# Patient Record
Sex: Male | Born: 1995 | Hispanic: No | Marital: Single | Smoking: Current every day smoker
Health system: Southern US, Community
[De-identification: ages and names within clinical notes are randomized; demographics above are authoritative.]

---
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
## 2016-11-07 ENCOUNTER — Encounter (HOSPITAL_COMMUNITY): Payer: Self-pay | Admitting: Emergency Medicine

## 2016-11-07 ENCOUNTER — Emergency Department (HOSPITAL_COMMUNITY)
Admission: EM | Admit: 2016-11-07 | Discharge: 2016-11-07 | Disposition: A | Discharge status: Home / Self Care | Payer: Medicaid Other | Attending: Emergency Medicine | Admitting: Emergency Medicine

## 2016-11-07 DIAGNOSIS — H6690 Otitis media, unspecified, unspecified ear: Secondary | ICD-10-CM

## 2016-11-07 DIAGNOSIS — H9203 Otalgia, bilateral: Secondary | ICD-10-CM | POA: Diagnosis present

## 2016-11-07 DIAGNOSIS — H6693 Otitis media, unspecified, bilateral: Secondary | ICD-10-CM | POA: Diagnosis not present

## 2016-11-07 MED ORDER — NEOMYCIN-COLIST-HC-THONZONIUM 3.3-3-10-0.5 MG/ML OT SUSP
3.0000 [drp] | Freq: Once | OTIC | Status: DC
  Filled 2016-11-07: qty 5

## 2016-11-07 MED ORDER — AMOXICILLIN-POT CLAVULANATE 875-125 MG PO TABS
1.0000 | ORAL_TABLET | Freq: Once | ORAL | Status: AC
  Administered 2016-11-07: 1 via ORAL
  Filled 2016-11-07: qty 1

## 2016-11-07 MED ORDER — AMOXICILLIN-POT CLAVULANATE 500-125 MG PO TABS: 1.0000 | ORAL_TABLET | Freq: Three times a day (TID) | ORAL | 0 refills | Status: DC

## 2016-11-07 MED ORDER — NEOMYCIN-POLYMYXIN-HC 1 % OT SOLN
3.0000 [drp] | Freq: Once | OTIC | Status: AC
  Administered 2016-11-07: 3 [drp] via OTIC
  Filled 2016-11-07: qty 10

## 2016-11-07 NOTE — ED Triage Notes (Signed)
Pt states, " I have an ear infection. I had an ear infection 2 years ago and it felt the same." Pt complaining of ear pain and difficulty hearing. Pt denies any drainage from ears.  Pt also complains of cold symptoms x 1 week.

## 2016-11-07 NOTE — ED Provider Notes (Signed)
[facility]-EMERGENCY DEPT Provider Note   CSN: [id number] Arrival date & time: 11/07/16  1614   By signing my name below, I, Freida Busmaniana Omoyeni, attest that this documentation has been prepared under the direction and in the presence of Gerhard Munchobert Britain Saber, MD . Electronically Signed: Freida Busmaniana Omoyeni, Scribe. 11/07/2016. 4:50 PM.  History   Chief Complaint Chief Complaint  Patient presents with  . Otalgia    The history is provided by the patient. No language interpreter was used.     HPI Comments:  Joseph Bartlett is a 20 y.o. male who presents to the Emergency Department complaining of constant, moderate, bilateral ear pain since this AM. He reports associated congestion and decreased hearing.  Pt states he felt fine when he went to bed last night. No alleviating factors noted. He denies fever, nausea, CP, SOB, confusion, and speech changes.     History reviewed. No pertinent past medical history.  There are no active problems to display for this patient.   History reviewed. No pertinent surgical history.     Home Medications    Prior to Admission medications   Medication Sig Start Date End Date Taking? Authorizing Provider  cephALEXin (KEFLEX) 500 MG capsule Take 1 capsule (500 mg total) by mouth 4 (four) times daily. 05/20/15   Eyvonne MechanicJeffrey Hedges, PA-C  ibuprofen (ADVIL,MOTRIN) 800 MG tablet Take 1 tablet (800 mg total) by mouth 3 (three) times daily. 11/13/15   Cheri FowlerKayla Rose, PA-C    Family History No family history on file.  Social History Social History  Substance Use Topics  . Smoking status: Never Smoker  . Smokeless tobacco: Never Used  . Alcohol use No     Allergies   Patient has no known allergies.   Review of Systems Review of Systems  Constitutional:       Per HPI, otherwise negative  HENT:       Per HPI, otherwise negative  Respiratory:       Per HPI, otherwise negative  Cardiovascular:       Per HPI, otherwise negative  Gastrointestinal: Negative for vomiting.    Endocrine:       Negative aside from HPI  Genitourinary:       Neg aside from HPI   Musculoskeletal:       Per HPI, otherwise negative  Skin: Negative.   Neurological: Negative for syncope.     Physical Exam Updated Vital Signs BP 148/66 (BP Location: Left Arm)   Pulse 68   Temp 98.2 F (36.8 C) (Oral)   Resp 15   SpO2 96%   Physical Exam  Constitutional: He is oriented to person, place, and time. He appears well-developed. No distress.  HENT:  Head: Normocephalic and atraumatic.  Right Ear: Tympanic membrane is injected. A middle ear effusion is present.  Left Ear: Tympanic membrane is injected. A middle ear effusion is present.  Mouth/Throat: Oropharynx is clear and moist.  Eyes: Conjunctivae and EOM are normal.  Cardiovascular: Normal rate and regular rhythm.   Pulmonary/Chest: Effort normal. No stridor. No respiratory distress.  Abdominal: He exhibits no distension.  Musculoskeletal: He exhibits no edema.  Neurological: He is alert and oriented to person, place, and time.  Skin: Skin is warm and dry.  Psychiatric: He has a normal mood and affect.  Nursing note and vitals reviewed.    ED Treatments / Results  DIAGNOSTIC STUDIES:  Oxygen Saturation is 96% on RA, normal by my interpretation.    COORDINATION OF CARE:  4:40 PM Discussed  treatment plan with pt at bedside and pt agreed to plan.    Procedures Procedures (including critical care time)  Medications Ordered in ED Medications  NEOMYCIN-POLYMYXIN-HYDROCORTISONE (CORTISPORIN) otic solution 3 drop (not administered)  amoxicillin-clavulanate (AUGMENTIN) 875-125 MG per tablet 1 tablet (1 tablet Oral Given 11/07/16 1703)     Initial Impression / Assessment and Plan / ED Course  I have reviewed the triage vital signs and the nursing notes.  Pertinent labs & imaging results that were available during my care of the patient were reviewed by me and considered in my medical decision making (see chart for  details).  Clinical Course     Generally well-appearing male presents with new right greater than left ear pain over the past day. On exam he is awake and alert, with no evidence for bacteremia or sepsis. There are some evidence for bilateral ear infection, patient started on oral and topical antibiotics with analgesia.   Final Clinical Impressions(s) / ED Diagnoses   Final diagnoses:  Acute otitis media, unspecified otitis media type    New Prescriptions New Prescriptions   AMOXICILLIN-CLAVULANATE (AUGMENTIN) 500-125 MG TABLET    Take 1 tablet (500 mg total) by mouth every 8 (eight) hours.    I personally performed the services described in this documentation, which was scribed in my presence. The recorded information has been reviewed and is accurate.        Gerhard Munchobert Starlene Consuegra, MD 11/07/16 [phone]

## 2016-11-07 NOTE — Discharge Instructions (Signed)
As discussed, it is important use all medication as directed, including prescribed antibiotics, and the provided antibiotic drops.  Please use the antibiotic drops 3 drops 3 times daily in each year for the next 5 days. Be sure to follow-up with your primary care physician.

## 2018-11-19 ENCOUNTER — Other Ambulatory Visit: Payer: Self-pay

## 2018-11-19 ENCOUNTER — Encounter (HOSPITAL_COMMUNITY): Payer: Self-pay

## 2018-11-19 ENCOUNTER — Emergency Department (HOSPITAL_COMMUNITY)
Admission: EM | Admit: 2018-11-19 | Discharge: 2018-11-19 | Disposition: A | Discharge status: Home / Self Care | Payer: Self-pay | Attending: Emergency Medicine | Admitting: Emergency Medicine

## 2018-11-19 DIAGNOSIS — J029 Acute pharyngitis, unspecified: Secondary | ICD-10-CM | POA: Insufficient documentation

## 2018-11-19 LAB — GROUP A STREP BY PCR: Group A Strep by PCR: NOT DETECTED

## 2018-11-19 MED ORDER — NAPROXEN 500 MG PO TABS: 500.0000 mg | ORAL_TABLET | Freq: Two times a day (BID) | ORAL | 0 refills | Status: DC

## 2018-11-19 NOTE — Discharge Instructions (Addendum)
Drink plenty of fluids, you can also try taking over-the-counter Cepacol, we will call you if the strep culture comes back positive.

## 2018-11-19 NOTE — ED Provider Notes (Signed)
[facility]   CSN: [id number] Arrival date & time: 11/19/18  1316    History   Chief Complaint Chief Complaint  Patient presents with  . Sore Throat    HPI Joseph Bartlett is a 22 y.o. male.  HPI Patient presented to the emergency room for evaluation of her throat.  Patient states his symptoms started the last couple of days.  It hurts and burns when he tries to swallow.  He is noticed some swollen glands in his neck.  They are tender to the touch.  Patient has had subjective fevers but has not measured a temperature.  He denies any difficulty speaking.  No cough.  No nasal congestion. History reviewed. No pertinent past medical history.  There are no active problems to display for this patient.   History reviewed. No pertinent surgical history.      Home Medications    Prior to Admission medications   Medication Sig Start Date End Date Taking? Authorizing Provider  amoxicillin-clavulanate (AUGMENTIN) 500-125 MG tablet Take 1 tablet (500 mg total) by mouth every 8 (eight) hours. 11/07/16   Gerhard MunchLockwood, Robert, MD  ibuprofen (ADVIL,MOTRIN) 800 MG tablet Take 1 tablet (800 mg total) by mouth 3 (three) times daily. 11/13/15   Cheri Fowlerose, Kayla, PA-C  naproxen (NAPROSYN) 500 MG tablet Take 1 tablet (500 mg total) by mouth 2 (two) times daily. 11/19/18   Linwood DibblesKnapp, Angelmarie Ponzo, MD    Family History No family history on file.  Social History Social History   Tobacco Use  . Smoking status: Never Smoker  . Smokeless tobacco: Never Used  Substance Use Topics  . Alcohol use: No  . Drug use: No     Allergies   Patient has no known allergies.   Review of Systems Review of Systems  All other systems reviewed and are negative.    Physical Exam Updated Vital Signs BP 134/70 (BP Location: Right Arm)   Pulse 95   Temp 99.4 F (37.4 C) (Oral)   Resp 18   Ht 1.651 m (5\' 5" )   Wt 90.7 kg   SpO2 98%   BMI 33.28 kg/m   Physical  Exam Vitals signs and nursing note reviewed.  Constitutional:      General: He is not in acute distress.    Appearance: He is well-developed.  HENT:     Head: Normocephalic and atraumatic.     Right Ear: External ear normal.     Left Ear: External ear normal.     Mouth/Throat:     Mouth: Mucous membranes are moist.     Pharynx: Pharyngeal swelling, oropharyngeal exudate and posterior oropharyngeal erythema present.     Tonsils: Tonsillar exudate present. No tonsillar abscesses. Swelling: 3+ on the right. 3+ on the left.  Eyes:     General: No scleral icterus.       Right eye: No discharge.        Left eye: No discharge.     Conjunctiva/sclera: Conjunctivae normal.  Neck:     Musculoskeletal: Neck supple.     Trachea: No tracheal deviation.  Cardiovascular:     Rate and Rhythm: Normal rate.  Pulmonary:     Effort: Pulmonary effort is normal. No respiratory distress.     Breath sounds: No stridor.  Abdominal:     General: There is no distension.  Lymphadenopathy:     Cervical: Cervical adenopathy present.  Skin:    General: Skin is warm and dry.  Findings: No rash.  Neurological:     Mental Status: He is alert.     Cranial Nerves: Cranial nerve deficit: no gross deficits.      ED Treatments / Results  Labs (all labs ordered are listed, but only abnormal results are displayed) Labs Reviewed  GROUP A STREP BY PCR  CULTURE, GROUP A STREP [facility]     Procedures Procedures (including critical care time)  Medications Ordered in ED Medications - No data to display   Initial Impression / Assessment and Plan / ED Course  I have reviewed the triage vital signs and the nursing notes.  Pertinent labs & imaging results that were available during my care of the patient were reviewed by me and considered in my medical decision making (see chart for details).   Patient strep screen is negative.  This is most likely a viral pharyngitis.  No findings to suggest an abscess.   Plan on discharge home with Naprosyn and recommend taking Cepacol.  Throat culture was sent off for additional analysis  Final Clinical Impressions(s) / ED Diagnoses   Final diagnoses:  Viral pharyngitis    ED Discharge Orders         Ordered    naproxen (NAPROSYN) 500 MG tablet  2 times daily     11/19/18 1455           Linwood DibblesKnapp, Shaquon Gropp, MD 11/19/18 1457

## 2018-11-19 NOTE — ED Triage Notes (Signed)
Pt c/o sore throat X 3 days reports last taking ibuprofen around 7 am today. Pt also c/o swollen and tender neck.

## 2018-11-19 NOTE — ED Notes (Signed)
Pt verbalized understanding of discharge instructions and denies any further questions at this time.   

## 2018-11-20 ENCOUNTER — Encounter (HOSPITAL_COMMUNITY): Payer: Self-pay | Admitting: Emergency Medicine

## 2020-09-11 ENCOUNTER — Encounter (HOSPITAL_COMMUNITY): Payer: Self-pay | Admitting: *Deleted

## 2020-09-11 ENCOUNTER — Other Ambulatory Visit: Payer: Self-pay

## 2020-09-11 ENCOUNTER — Emergency Department (HOSPITAL_COMMUNITY)
Admission: EM | Admit: 2020-09-11 | Discharge: 2020-09-11 | Disposition: A | Discharge status: Home / Self Care | Payer: Medicaid Other | Attending: Emergency Medicine | Admitting: Emergency Medicine

## 2020-09-11 ENCOUNTER — Emergency Department (HOSPITAL_COMMUNITY): Payer: Medicaid Other

## 2020-09-11 ENCOUNTER — Other Ambulatory Visit: Payer: Self-pay | Admitting: Orthopedic Surgery

## 2020-09-11 DIAGNOSIS — S62316A Displaced fracture of base of fifth metacarpal bone, right hand, initial encounter for closed fracture: Secondary | ICD-10-CM | POA: Insufficient documentation

## 2020-09-11 DIAGNOSIS — S62339A Displaced fracture of neck of unspecified metacarpal bone, initial encounter for closed fracture: Secondary | ICD-10-CM

## 2020-09-11 DIAGNOSIS — W228XXA Striking against or struck by other objects, initial encounter: Secondary | ICD-10-CM | POA: Insufficient documentation

## 2020-09-11 NOTE — ED Triage Notes (Signed)
The pt has had some rt hand pain 2 days ago at work when his hand went through the sheet rock  Pain over his 5thmetacarpal

## 2020-09-11 NOTE — Consult Note (Addendum)
Reason for Consult:Right 5th [facility] fx Referring Physician: R Little  Joseph Bartlett is an 24 y.o. male.  HPI: [name] hit a 2x4 with his right hand ~10d ago. He's been having pain ever since. He went and saw someone who works with hands (not a provider) who first told him it was ok, just dislocated. Then he went back and he told him it was broken. He is RHD.  History reviewed. No pertinent past medical history.  History reviewed. No pertinent surgical history.  No family history on file.  Social History:  reports that he has never smoked. He has never used smokeless tobacco. He reports that he does not drink alcohol and does not use drugs.  Allergies: No Known Allergies  Medications: I have reviewed the patient's current medications.  No results found for this or any previous visit (from the past 48 hour(s)).  DG Hand Complete Right  Result Date: 09/11/2020 CLINICAL DATA:  Right hand pain, right hand injury EXAM: RIGHT HAND - COMPLETE 3+ VIEW COMPARISON:  11/13/2015 FINDINGS: None three view radiograph right hand demonstrates a subacute fracture of the mid-diaphysis of the right fifth metacarpal demonstrating moderate volar angulation and incomplete bridging callus in keeping with incomplete interval healing. Extensive surrounding soft tissue swelling is present. No other fracture or dislocation. Joint spaces are preserved. IMPRESSION: Subacute, angulated mid diaphyseal fracture of the right fifth metacarpal demonstrating incomplete healing. Electronically Signed   By: Helyn Numbers MD   On: 09/11/2020 00:38    Review of Systems  HENT: Negative for ear discharge, ear pain, hearing loss and tinnitus.   Eyes: Negative for photophobia and pain.  Respiratory: Negative for cough and shortness of breath.   Cardiovascular: Negative for chest pain.  Gastrointestinal: Negative for abdominal pain, nausea and vomiting.  Genitourinary: Negative for dysuria, flank pain, frequency and urgency.   Musculoskeletal: Positive for arthralgias (Right hand). Negative for back pain, myalgias and neck pain.  Neurological: Negative for dizziness and headaches.  Hematological: Does not bruise/bleed easily.  Psychiatric/Behavioral: The patient is not nervous/anxious.    Blood pressure (!) 127/57, pulse (!) 57, temperature 98 F (36.7 C), temperature source Oral, resp. rate 16, height 5\' 5"  (1.651 m), weight 90.7 kg, SpO2 99 %. Physical Exam Constitutional:      General: He is not in acute distress.    Appearance: He is well-developed. He is not diaphoretic.  HENT:     Head: Normocephalic and atraumatic.  Eyes:     General: No scleral icterus.       Right eye: No discharge.        Left eye: No discharge.     Conjunctiva/sclera: Conjunctivae normal.  Cardiovascular:     Rate and Rhythm: Normal rate and regular rhythm.  Pulmonary:     Effort: Pulmonary effort is normal. No respiratory distress.  Musculoskeletal:     Cervical back: Normal range of motion.     Comments: Right shoulder, elbow, wrist, digits- no skin wounds, mod TTP 5th MC w/deformity, no instability, no blocks to motion  Sens  Ax/R/M/U intact  Mot   Ax/ R/ PIN/ M/ AIN/ U intact  Rad 2+  Skin:    General: Skin is warm and dry.  Neurological:     Mental Status: He is alert.  Psychiatric:        Behavior: Behavior normal.     Assessment/Plan: Right 5th [facility] fx -- Dr. to evaluate and decide on plan.    Janee Morn, PA-C  Orthopedic Surgery [phone] 09/11/2020, 9:04 AM   Patient evaluated and examined by me, and I agree with above.  History provided to me indicates that the fracture was perhaps longer than a week ago, perhaps even a month ago.  It occurred when his hand struck a 2 x 4 at work by his report.  Right fifth metacarpal obvious apex dorsal angulation fracture, with nearly full flexion, but 30 degree extensor lag at the MP joint.  No significant malrotation, no longer swollen, fracture  not mobile to manipulation, MVI  Displaced angulated right fifth metacarpal diaphyseal fracture, now perhaps a month old with visible callus on x-ray  I discussed these findings with him and reviewed what he could expect with continued nonoperative treatment, with the depressed posture of the "knuckle", some prominence of the metacarpal head on the palmar side, and more importantly the continued extensor lag.  I reviewed with him what operative reconstruction would entail and the goals, risk, and options.  He indicated he would like to proceed operatively.  We will work to schedule this as an outpatient.  Given his state today, he may remain unsplinted, etc.  Mack Hook, MD Hand Surgery

## 2020-09-11 NOTE — ED Provider Notes (Signed)
[facility] EMERGENCY DEPARTMENT Provider Note   CSN: [id number] Arrival date & time: 09/11/20  0006     History Chief Complaint  Patient presents with  . Hand Pain    Joseph Bartlett is a 24 y.o. male.  24 year old male who presents with right hand injury. 10 days ago, patient was at work [employer] when his hand slipped and he accidentally punched the wall in front of him. He initially had pain over his fifth metacarpal and swelling which he thought would improve but he continues to have mild pain and the swelling has not gone down. He reports normal sensation in his fingers and full range of motion of his fingers. He did not sustain any lacerations and denies any other areas of pain. He is right-handed.  The history is provided by the patient.  Hand Pain       History reviewed. No pertinent past medical history.  There are no problems to display for this patient.   History reviewed. No pertinent surgical history.     No family history on file.  Social History   Tobacco Use  . Smoking status: Never Smoker  . Smokeless tobacco: Never Used  Substance Use Topics  . Alcohol use: No  . Drug use: No    Types: Marijuana    Home Medications Prior to Admission medications   Medication Sig Start Date End Date Taking? Authorizing Provider  amoxicillin-clavulanate (AUGMENTIN) 500-125 MG tablet Take 1 tablet (500 mg total) by mouth every 8 (eight) hours. 11/07/16   Gerhard Munch, MD  ibuprofen (ADVIL,MOTRIN) 200 MG tablet Take 400 mg by mouth every 6 (six) hours as needed. For pain    [provider]  ibuprofen (ADVIL,MOTRIN) 800 MG tablet Take 1 tablet (800 mg total) by mouth 3 (three) times daily. 11/13/15   Cheri Fowler, PA-C  naproxen (NAPROSYN) 375 MG tablet Take 1 tablet (375 mg total) by mouth 2 (two) times daily as needed (pain). Patient not taking: Reported on 11/10/2014 09/06/12   Marcellina Millin, MD  naproxen (NAPROSYN) 500 MG  tablet Take 1 tablet (500 mg total) by mouth 2 (two) times daily. 11/19/18   Linwood Dibbles, MD  oxyCODONE-acetaminophen (PERCOCET) 5-325 MG per tablet Take 1 tablet by mouth every 6 (six) hours as needed for severe pain. 11/10/14   Pricilla Loveless, MD    Allergies    Patient has no known allergies.  Review of Systems   Review of Systems  Musculoskeletal: Positive for joint swelling.  Skin: Negative for wound.  Neurological: Negative for numbness.    Physical Exam Updated Vital Signs BP (!) 127/57 (BP Location: Left Arm)   Pulse (!) 57   Temp 98 F (36.7 C) (Oral)   Resp 16   Ht 5\' 5"  (1.651 m)   Wt 90.7 kg   SpO2 99%   BMI 33.27 kg/m   Physical Exam Vitals and nursing note reviewed.  Constitutional:      General: He is not in acute distress.    Appearance: He is well-developed.  HENT:     Head: Normocephalic and atraumatic.  Eyes:     Conjunctiva/sclera: Conjunctivae normal.  Cardiovascular:     Pulses: Normal pulses.  Musculoskeletal:        General: Swelling and signs of injury present.     Cervical back: Neck supple.     Comments: R hand: edema and tenderness over distal dorsal 5th metacarpal; 5th finger rotates radially when making a fist although  not complete overlapping with 4th finger; full ROM fingers with normal sensation and strength; normal ROM wrist  Skin:    General: Skin is warm and dry.  Neurological:     Mental Status: He is alert and oriented to person, place, and time.     Sensory: No sensory deficit.  Psychiatric:        Judgment: Judgment normal.     ED Results / Procedures / Treatments   Labs (all labs ordered are listed, but only abnormal results are displayed) Labs Reviewed - No data to display  EKG None  Radiology DG Hand Complete Right  Result Date: 09/11/2020 CLINICAL DATA:  Right hand pain, right hand injury EXAM: RIGHT HAND - COMPLETE 3+ VIEW COMPARISON:  11/13/2015 FINDINGS: None three view radiograph right hand demonstrates a  subacute fracture of the mid-diaphysis of the right fifth metacarpal demonstrating moderate volar angulation and incomplete bridging callus in keeping with incomplete interval healing. Extensive surrounding soft tissue swelling is present. No other fracture or dislocation. Joint spaces are preserved. IMPRESSION: Subacute, angulated mid diaphyseal fracture of the right fifth metacarpal demonstrating incomplete healing. Electronically Signed   By: Helyn Numbers MD   On: 09/11/2020 00:38    Procedures Procedures (including critical care time)  Medications Ordered in ED Medications - No data to display  ED Course  I have reviewed the triage vital signs and the nursing notes.  Pertinent imaging results that were available during my care of the patient were reviewed by me and considered in my medical decision making (see chart for details).    MDM Rules/Calculators/A&P                          Neurovascularly intact. XR shows subacute angulated fx R 5th metacarpal. Discussed w/ ortho hand, Charma Igo and Dr. Janee Morn. Given pt is already healing, suspect injury was greater than 10d ago and pt doesn't require splinting today. They will contact for outpatient surgical repair. Pt voiced understanding of plan. Final Clinical Impression(s) / ED Diagnoses Final diagnoses:  Closed boxer's fracture, initial encounter    Rx / DC Orders ED Discharge Orders    None       Charnita Trudel, Ambrose Finland, MD 09/11/20 [phone]

## 2020-09-11 NOTE — H&P (View-Only) (Signed)
Reason for Consult:Right 5th [facility] fx Referring Physician: R Little  Joseph Bartlett is an 24 y.o. male.  HPI: Joseph Bartlett hit a 2x4 with his right hand ~10d ago. He's been having pain ever since. He went and saw someone who works with hands (not a provider) who first told him it was ok, just dislocated. Then he went back and he told him it was broken. He is RHD.  History reviewed. No pertinent past medical history.  History reviewed. No pertinent surgical history.  No family history on file.  Social History:  reports that he has never smoked. He has never used smokeless tobacco. He reports that he does not drink alcohol and does not use drugs.  Allergies: No Known Allergies  Medications: I have reviewed the patient's current medications.  No results found for this or any previous visit (from the past 48 hour(s)).  DG Hand Complete Right  Result Date: 09/11/2020 CLINICAL DATA:  Right hand pain, right hand injury EXAM: RIGHT HAND - COMPLETE 3+ VIEW COMPARISON:  11/13/2015 FINDINGS: None three view radiograph right hand demonstrates a subacute fracture of the mid-diaphysis of the right fifth metacarpal demonstrating moderate volar angulation and incomplete bridging callus in keeping with incomplete interval healing. Extensive surrounding soft tissue swelling is present. No other fracture or dislocation. Joint spaces are preserved. IMPRESSION: Subacute, angulated mid diaphyseal fracture of the right fifth metacarpal demonstrating incomplete healing. Electronically Signed   By: Ashesh  Parikh MD   On: 09/11/2020 00:38    Review of Systems  HENT: Negative for ear discharge, ear pain, hearing loss and tinnitus.   Eyes: Negative for photophobia and pain.  Respiratory: Negative for cough and shortness of breath.   Cardiovascular: Negative for chest pain.  Gastrointestinal: Negative for abdominal pain, nausea and vomiting.  Genitourinary: Negative for dysuria, flank pain, frequency and urgency.   Musculoskeletal: Positive for arthralgias (Right hand). Negative for back pain, myalgias and neck pain.  Neurological: Negative for dizziness and headaches.  Hematological: Does not bruise/bleed easily.  Psychiatric/Behavioral: The patient is not nervous/anxious.    Blood pressure (!) 127/57, pulse (!) 57, temperature 98 F (36.7 C), temperature source Oral, resp. rate 16, height 5' 5" (1.651 m), weight 90.7 kg, SpO2 99 %. Physical Exam Constitutional:      General: He is not in acute distress.    Appearance: He is well-developed. He is not diaphoretic.  HENT:     Head: Normocephalic and atraumatic.  Eyes:     General: No scleral icterus.       Right eye: No discharge.        Left eye: No discharge.     Conjunctiva/sclera: Conjunctivae normal.  Cardiovascular:     Rate and Rhythm: Normal rate and regular rhythm.  Pulmonary:     Effort: Pulmonary effort is normal. No respiratory distress.  Musculoskeletal:     Cervical back: Normal range of motion.     Comments: Right shoulder, elbow, wrist, digits- no skin wounds, mod TTP 5th MC w/deformity, no instability, no blocks to motion  Sens  Ax/R/M/U intact  Mot   Ax/ R/ PIN/ M/ AIN/ U intact  Rad 2+  Skin:    General: Skin is warm and dry.  Neurological:     Mental Status: He is alert.  Psychiatric:        Behavior: Behavior normal.     Assessment/Plan: Right 5th MC fx -- Dr. Suzy Kugel to evaluate and decide on plan.    Joseph J. Jeffery, PA-C   Orthopedic Surgery [phone] 09/11/2020, 9:04 AM   Patient evaluated and examined by me, and I agree with above.  History provided to me indicates that the fracture was perhaps longer than a week ago, perhaps even a month ago.  It occurred when his hand struck a 2 x 4 at work by his report.  Right fifth metacarpal obvious apex dorsal angulation fracture, with nearly full flexion, but 30 degree extensor lag at the MP joint.  No significant malrotation, no longer swollen, fracture  not mobile to manipulation, MVI  Displaced angulated right fifth metacarpal diaphyseal fracture, now perhaps a month old with visible callus on x-ray  I discussed these findings with him and reviewed what he could expect with continued nonoperative treatment, with the depressed posture of the "knuckle", some prominence of the metacarpal head on the palmar side, and more importantly the continued extensor lag.  I reviewed with him what operative reconstruction would entail and the goals, risk, and options.  He indicated he would like to proceed operatively.  We will work to schedule this as an outpatient.  Given his state today, he may remain unsplinted, etc.  Mack Hook, MD Hand Surgery

## 2020-09-12 ENCOUNTER — Encounter (HOSPITAL_BASED_OUTPATIENT_CLINIC_OR_DEPARTMENT_OTHER): Payer: Self-pay | Admitting: Orthopedic Surgery

## 2020-09-12 ENCOUNTER — Other Ambulatory Visit: Payer: Self-pay

## 2020-09-12 DIAGNOSIS — S62339A Displaced fracture of neck of unspecified metacarpal bone, initial encounter for closed fracture: Secondary | ICD-10-CM

## 2020-09-12 HISTORY — DX: Displaced fracture of neck of unspecified metacarpal bone, initial encounter for closed fracture: S62.339A

## 2020-09-14 ENCOUNTER — Other Ambulatory Visit (HOSPITAL_COMMUNITY)
Admission: RE | Admit: 2020-09-14 | Discharge: 2020-09-14 | Disposition: A | Discharge status: Home / Self Care | Payer: Medicaid Other | Source: Ambulatory Visit | Attending: Orthopedic Surgery | Admitting: Orthopedic Surgery

## 2020-09-14 DIAGNOSIS — Z01812 Encounter for preprocedural laboratory examination: Secondary | ICD-10-CM | POA: Diagnosis not present

## 2020-09-14 DIAGNOSIS — Z20822 Contact with and (suspected) exposure to covid-19: Secondary | ICD-10-CM | POA: Diagnosis not present

## 2020-09-14 LAB — SARS CORONAVIRUS 2 (TAT 6-24 HRS): SARS Coronavirus 2: NEGATIVE

## 2020-09-19 ENCOUNTER — Encounter (HOSPITAL_BASED_OUTPATIENT_CLINIC_OR_DEPARTMENT_OTHER): Payer: Self-pay | Admitting: Orthopedic Surgery

## 2020-09-21 ENCOUNTER — Other Ambulatory Visit (HOSPITAL_COMMUNITY): Payer: Medicaid Other

## 2020-09-22 ENCOUNTER — Other Ambulatory Visit (HOSPITAL_COMMUNITY)
Admission: RE | Admit: 2020-09-22 | Discharge: 2020-09-22 | Disposition: A | Discharge status: Home / Self Care | Payer: Medicaid Other | Source: Ambulatory Visit | Attending: Orthopedic Surgery | Admitting: Orthopedic Surgery

## 2020-09-22 DIAGNOSIS — Z01812 Encounter for preprocedural laboratory examination: Secondary | ICD-10-CM | POA: Diagnosis not present

## 2020-09-22 DIAGNOSIS — Z20822 Contact with and (suspected) exposure to covid-19: Secondary | ICD-10-CM | POA: Insufficient documentation

## 2020-09-22 LAB — SARS CORONAVIRUS 2 (TAT 6-24 HRS): SARS Coronavirus 2: NEGATIVE

## 2020-09-25 ENCOUNTER — Ambulatory Visit (HOSPITAL_COMMUNITY): Payer: Self-pay

## 2020-09-25 ENCOUNTER — Other Ambulatory Visit: Payer: Self-pay

## 2020-09-25 ENCOUNTER — Ambulatory Visit (HOSPITAL_BASED_OUTPATIENT_CLINIC_OR_DEPARTMENT_OTHER): Payer: Self-pay | Admitting: Certified Registered"

## 2020-09-25 ENCOUNTER — Ambulatory Visit (HOSPITAL_BASED_OUTPATIENT_CLINIC_OR_DEPARTMENT_OTHER)
Admission: RE | Admit: 2020-09-25 | Discharge: 2020-09-25 | Disposition: A | Discharge status: Home / Self Care | Payer: Self-pay | Attending: Orthopedic Surgery | Admitting: Orthopedic Surgery

## 2020-09-25 ENCOUNTER — Encounter (HOSPITAL_BASED_OUTPATIENT_CLINIC_OR_DEPARTMENT_OTHER)
Admission: RE | Disposition: A | Discharge status: Home / Self Care | Payer: Self-pay | Source: Home / Self Care | Attending: Orthopedic Surgery

## 2020-09-25 ENCOUNTER — Encounter (HOSPITAL_BASED_OUTPATIENT_CLINIC_OR_DEPARTMENT_OTHER): Payer: Self-pay | Admitting: Orthopedic Surgery

## 2020-09-25 DIAGNOSIS — S62309A Unspecified fracture of unspecified metacarpal bone, initial encounter for closed fracture: Secondary | ICD-10-CM

## 2020-09-25 DIAGNOSIS — Y939 Activity, unspecified: Secondary | ICD-10-CM | POA: Insufficient documentation

## 2020-09-25 DIAGNOSIS — W2209XA Striking against other stationary object, initial encounter: Secondary | ICD-10-CM | POA: Insufficient documentation

## 2020-09-25 DIAGNOSIS — S62396A Other fracture of fifth metacarpal bone, right hand, initial encounter for closed fracture: Secondary | ICD-10-CM | POA: Insufficient documentation

## 2020-09-25 HISTORY — PX: OPEN REDUCTION INTERNAL FIXATION (ORIF) METACARPAL: SHX6234

## 2020-09-25 SURGERY — OPEN REDUCTION INTERNAL FIXATION (ORIF) METACARPAL
Anesthesia: Regional | Site: Finger | Laterality: Right

## 2020-09-25 MED ORDER — LACTATED RINGERS IV SOLN: INTRAVENOUS | Status: DC

## 2020-09-25 MED ORDER — OXYCODONE HCL 5 MG/5ML PO SOLN: 5.0000 mg | Freq: Once | ORAL | Status: DC | PRN

## 2020-09-25 MED ORDER — CEFAZOLIN SODIUM-DEXTROSE 2-4 GM/100ML-% IV SOLN
INTRAVENOUS | Status: AC
  Filled 2020-09-25: qty 100

## 2020-09-25 MED ORDER — AMISULPRIDE (ANTIEMETIC) 5 MG/2ML IV SOLN: 10.0000 mg | Freq: Once | INTRAVENOUS | Status: DC | PRN

## 2020-09-25 MED ORDER — ACETAMINOPHEN 160 MG/5ML PO SOLN: 325.0000 mg | Freq: Once | ORAL | Status: DC | PRN

## 2020-09-25 MED ORDER — BUPIVACAINE-EPINEPHRINE (PF) 0.5% -1:200000 IJ SOLN
INTRAMUSCULAR | Status: AC
  Filled 2020-09-25: qty 30

## 2020-09-25 MED ORDER — FENTANYL CITRATE (PF) 100 MCG/2ML IJ SOLN: 25.0000 ug | INTRAMUSCULAR | Status: DC | PRN

## 2020-09-25 MED ORDER — ACETAMINOPHEN 325 MG PO TABS: 650.0000 mg | ORAL_TABLET | Freq: Four times a day (QID) | ORAL | 0 refills | Status: AC

## 2020-09-25 MED ORDER — DEXMEDETOMIDINE (PRECEDEX) IN NS 20 MCG/5ML (4 MCG/ML) IV SYRINGE
PREFILLED_SYRINGE | INTRAVENOUS | Status: DC | PRN
  Administered 2020-09-25: 4 ug via INTRAVENOUS
  Administered 2020-09-25: 8 ug via INTRAVENOUS

## 2020-09-25 MED ORDER — CEFAZOLIN SODIUM-DEXTROSE 2-4 GM/100ML-% IV SOLN
2.0000 g | INTRAVENOUS | Status: AC
  Administered 2020-09-25: 2 g via INTRAVENOUS

## 2020-09-25 MED ORDER — MEPERIDINE HCL 25 MG/ML IJ SOLN: 6.2500 mg | INTRAMUSCULAR | Status: DC | PRN

## 2020-09-25 MED ORDER — PROPOFOL 10 MG/ML IV BOLUS
INTRAVENOUS | Status: DC | PRN
  Administered 2020-09-25: 200 mg via INTRAVENOUS

## 2020-09-25 MED ORDER — BUPIVACAINE HCL (PF) 0.5 % IJ SOLN
INTRAMUSCULAR | Status: AC
  Filled 2020-09-25: qty 30

## 2020-09-25 MED ORDER — ACETAMINOPHEN 325 MG PO TABS: 325.0000 mg | ORAL_TABLET | Freq: Once | ORAL | Status: DC | PRN

## 2020-09-25 MED ORDER — BUPIVACAINE-EPINEPHRINE (PF) 0.5% -1:200000 IJ SOLN
INTRAMUSCULAR | Status: DC | PRN
  Administered 2020-09-25: 30 mL via PERINEURAL

## 2020-09-25 MED ORDER — FENTANYL CITRATE (PF) 100 MCG/2ML IJ SOLN
100.0000 ug | Freq: Once | INTRAMUSCULAR | Status: AC
  Administered 2020-09-25: 100 ug via INTRAVENOUS

## 2020-09-25 MED ORDER — PROPOFOL 500 MG/50ML IV EMUL
INTRAVENOUS | Status: DC | PRN
  Administered 2020-09-25: 125 ug/kg/min via INTRAVENOUS

## 2020-09-25 MED ORDER — ACETAMINOPHEN 10 MG/ML IV SOLN: 1000.0000 mg | Freq: Once | INTRAVENOUS | Status: DC | PRN

## 2020-09-25 MED ORDER — ONDANSETRON HCL 4 MG/2ML IJ SOLN
INTRAMUSCULAR | Status: DC | PRN
  Administered 2020-09-25: 4 mg via INTRAVENOUS

## 2020-09-25 MED ORDER — OXYCODONE HCL 5 MG PO TABS
5.0000 mg | ORAL_TABLET | Freq: Four times a day (QID) | ORAL | 0 refills | Status: AC | PRN
Start: 2020-09-25 — End: ?

## 2020-09-25 MED ORDER — EPINEPHRINE PF 1 MG/ML IJ SOLN
INTRAMUSCULAR | Status: AC
  Filled 2020-09-25: qty 2

## 2020-09-25 MED ORDER — MIDAZOLAM HCL 2 MG/2ML IJ SOLN
INTRAMUSCULAR | Status: AC
  Filled 2020-09-25: qty 2

## 2020-09-25 MED ORDER — LIDOCAINE HCL (PF) 1 % IJ SOLN
INTRAMUSCULAR | Status: AC
  Filled 2020-09-25: qty 30

## 2020-09-25 MED ORDER — OXYCODONE HCL 5 MG PO TABS: 5.0000 mg | ORAL_TABLET | Freq: Once | ORAL | Status: DC | PRN

## 2020-09-25 MED ORDER — LIDOCAINE 2% (20 MG/ML) 5 ML SYRINGE
INTRAMUSCULAR | Status: DC | PRN
  Administered 2020-09-25: 30 mg via INTRAVENOUS

## 2020-09-25 MED ORDER — IBUPROFEN 200 MG PO TABS: 600.0000 mg | ORAL_TABLET | Freq: Four times a day (QID) | ORAL | 0 refills | Status: AC

## 2020-09-25 MED ORDER — MIDAZOLAM HCL 2 MG/2ML IJ SOLN
2.0000 mg | Freq: Once | INTRAMUSCULAR | Status: AC
  Administered 2020-09-25: 2 mg via INTRAVENOUS

## 2020-09-25 MED ORDER — FENTANYL CITRATE (PF) 100 MCG/2ML IJ SOLN
INTRAMUSCULAR | Status: AC
  Filled 2020-09-25: qty 2

## 2020-09-25 SURGICAL SUPPLY — 61 items
APL PRP STRL LF DISP 70% ISPRP (MISCELLANEOUS) ×1
BIT DRILL 1.1 (BIT) ×2
BIT DRILL 1.1MM (BIT) ×1
BIT DRILL 60X20X1.1XQC TMX (BIT) IMPLANT
BIT DRL 60X20X1.1XQC TMX (BIT) ×1
BLADE MINI RND TIP GREEN BEAV (BLADE) IMPLANT
BLADE SURG 15 STRL LF DISP TIS (BLADE) ×1 IMPLANT
BLADE SURG 15 STRL SS (BLADE) ×3
BNDG CMPR 9X4 STRL LF SNTH (GAUZE/BANDAGES/DRESSINGS) ×1
BNDG COHESIVE 4X5 TAN STRL (GAUZE/BANDAGES/DRESSINGS) ×3 IMPLANT
BNDG ESMARK 4X9 LF (GAUZE/BANDAGES/DRESSINGS) ×3 IMPLANT
BNDG GAUZE ELAST 4 BULKY (GAUZE/BANDAGES/DRESSINGS) ×3 IMPLANT
CANISTER SUCT 1200ML W/VALVE (MISCELLANEOUS) IMPLANT
CHLORAPREP W/TINT 26 (MISCELLANEOUS) ×3 IMPLANT
CORD BIPOLAR FORCEPS 12FT (ELECTRODE) ×3 IMPLANT
COVER BACK TABLE 60X90IN (DRAPES) ×3 IMPLANT
COVER MAYO STAND STRL (DRAPES) ×3 IMPLANT
COVER WAND RF STERILE (DRAPES) IMPLANT
CUFF TOURN SGL QUICK 18X4 (TOURNIQUET CUFF) IMPLANT
DRAPE C-ARM 42X72 X-RAY (DRAPES) ×3 IMPLANT
DRAPE EXTREMITY T 121X128X90 (DISPOSABLE) ×3 IMPLANT
DRAPE SURG 17X23 STRL (DRAPES) ×3 IMPLANT
DRIVER BIT 1.5 (TRAUMA) ×2 IMPLANT
DRSG EMULSION OIL 3X3 NADH (GAUZE/BANDAGES/DRESSINGS) ×3 IMPLANT
GAUZE SPONGE 4X4 12PLY STRL LF (GAUZE/BANDAGES/DRESSINGS) ×3 IMPLANT
GLOVE BIO SURGEON STRL SZ7.5 (GLOVE) ×3 IMPLANT
GLOVE BIOGEL PI IND STRL 7.0 (GLOVE) ×1 IMPLANT
GLOVE BIOGEL PI IND STRL 8 (GLOVE) ×1 IMPLANT
GLOVE BIOGEL PI INDICATOR 7.0 (GLOVE) ×2
GLOVE BIOGEL PI INDICATOR 8 (GLOVE) ×2
GLOVE ECLIPSE 6.5 STRL STRAW (GLOVE) ×3 IMPLANT
GOWN STRL REUS W/ TWL LRG LVL3 (GOWN DISPOSABLE) ×2 IMPLANT
GOWN STRL REUS W/TWL LRG LVL3 (GOWN DISPOSABLE) ×6
GOWN STRL REUS W/TWL XL LVL3 (GOWN DISPOSABLE) ×3 IMPLANT
NDL HYPO 25X1 1.5 SAFETY (NEEDLE) IMPLANT
NEEDLE HYPO 25X1 1.5 SAFETY (NEEDLE) IMPLANT
NS IRRIG 1000ML POUR BTL (IV SOLUTION) ×3 IMPLANT
PACK BASIN DAY SURGERY FS (CUSTOM PROCEDURE TRAY) ×3 IMPLANT
PADDING CAST ABS 4INX4YD NS (CAST SUPPLIES)
PADDING CAST ABS COTTON 4X4 ST (CAST SUPPLIES) IMPLANT
PLATE T SMALL 1.5MM (Plate) ×2 IMPLANT
SCREW L 1.5X12 (Screw) ×2 IMPLANT
SCREW L 1.5X14 (Screw) ×4 IMPLANT
SCREW LOCKING 1.5X10 (Screw) ×4 IMPLANT
SCREW LOCKING 1.5X11MM (Screw) ×6 IMPLANT
SLEEVE SCD COMPRESS KNEE MED (MISCELLANEOUS) ×3 IMPLANT
SPLINT PLASTER CAST XFAST 3X15 (CAST SUPPLIES) ×1 IMPLANT
SPLINT PLASTER XTRA FASTSET 3X (CAST SUPPLIES) ×2
STOCKINETTE 6  STRL (DRAPES) ×3
STOCKINETTE 6 STRL (DRAPES) ×1 IMPLANT
SUCTION FRAZIER HANDLE 10FR (MISCELLANEOUS)
SUCTION TUBE FRAZIER 10FR DISP (MISCELLANEOUS) IMPLANT
SUT VICRYL 4-0 PS2 18IN ABS (SUTURE) ×2 IMPLANT
SUT VICRYL RAPIDE 4-0 (SUTURE) IMPLANT
SUT VICRYL RAPIDE 4/0 PS 2 (SUTURE) ×2 IMPLANT
SYR 10ML LL (SYRINGE) IMPLANT
SYR BULB EAR ULCER 3OZ GRN STR (SYRINGE) ×2 IMPLANT
TOWEL GREEN STERILE FF (TOWEL DISPOSABLE) ×3 IMPLANT
TUBE CONNECTING 20'X1/4 (TUBING)
TUBE CONNECTING 20X1/4 (TUBING) IMPLANT
UNDERPAD 30X36 HEAVY ABSORB (UNDERPADS AND DIAPERS) ×3 IMPLANT

## 2020-09-25 NOTE — Anesthesia Procedure Notes (Signed)
Procedure Name: LMA Insertion Date/Time: 09/25/2020 8:00 AM Performed by: Sheryn Bison, CRNA Pre-anesthesia Checklist: Patient identified, Emergency Drugs available, Suction available and Patient being monitored Patient Re-evaluated:Patient Re-evaluated prior to induction Oxygen Delivery Method: Circle System Utilized Preoxygenation: Pre-oxygenation with 100% oxygen Induction Type: IV induction Ventilation: Mask ventilation without difficulty LMA: LMA inserted LMA Size: 4.0 Number of attempts: 1 Airway Equipment and Method: bite block Placement Confirmation: positive ETCO2 Tube secured with: Tape Dental Injury: Teeth and Oropharynx as per pre-operative assessment

## 2020-09-25 NOTE — Op Note (Signed)
09/25/2020  7:35 AM  PATIENT:  Joseph Bartlett  24 y.o. 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 Grandville Silos, MD  PHYSICIAN ASSISTANT: none  ANESTHESIA:  regional and MAC  SPECIMENS:  None  DRAINS:   None  EBL:  less than 50 mL  PREOPERATIVE INDICATIONS:  Joseph Bartlett is a  24 y.o. male with a right 5th metacarpal fracture that has occurrred long enough ago that it is actually now united with significant volar angulation  The risks benefits and alternatives were discussed with the patient preoperatively including but not limited to the risks of infection, bleeding, nerve injury, cardiopulmonary complications, the need for revision surgery, among others, and the patient verbalized understanding and consented to proceed.  OPERATIVE IMPLANTS: Biomet ALPS 1.36m small T-plate/screws  OPERATIVE PROCEDURE:  After receiving prophylactic antibiotics and a regional block, the patient was escorted to the operative theatre and placed in a supine position.  A surgical "time-out" was performed during which the planned procedure, proposed operative site, and the correct patient identity were compared to the operative consent and agreement confirmed by the circulating nurse according to current facility policy.  Following application of a tourniquet to the operative extremity, the exposed skin was prepped with Chloraprep and draped in the usual sterile fashion.  The limb was exsanguinated with an Esmarch bandage and the tourniquet inflated to approximately 1055mg higher than systolic BP.  He actually remained agitated to some degree, and anesthesia was converted to general with an LMA.  A direct linear exposure was made dorsally over the fifth metacarpal with full-thickness flaps elevated.  The extensor tendons were followed distally and further became conjoined distally  was split for about a centimeter.  Periosteum was then excised longitudinally on the dorsum of the metacarpal.  It was quite thickened and subperiosteal dissection was performed.  There was already reorganizing callus that required removal with curettes and rongeurs back to negative cortex.  This was fairly substantial.  Once the callus and then excised, the fracture could be mobilized and reduced.  A small T plate was selected and applied to the dorsum of the metacarpal.  It was secured distally with 4 screws, proximally with 4 screws with the fracture anatomically aligned.  This was done with fluoroscopic guidance.  Following reduction and fixation of the malunion, clinically the small finger had a good touchdown point without malrotation or angulation.  The wound was copiously irrigated and the periosteum reapproximated with 4-0 Vicryl Rapide erupted suture.  The distal extensor split was reapproximated with 4-0 Vicryl running suture.  Tourniquet was released some additional hemostasis obtained with bipolar electrocautery and the skin was reapproximated with 4-0 Vicryl Rapide running horizontal mattress suture.  A short arm splint dressing was applied and he was taken to recovery in stable condition, breathing spontaneously.  DISPOSITION: He will be discharged home today with typical postop instructions, returning in 10 to 15 days with new x-rays of right hand out of splint.  We should keep the plaster in case we need to use it for additional postoperative immobilization.          ]

## 2020-09-25 NOTE — Anesthesia Postprocedure Evaluation (Signed)
Anesthesia Post Note  Patient: Janiel Crisostomo  Procedure(s) Performed: OPEN REDUCTION INTERNAL FIXATION RIGHT FIFTH METACARPAL FRACTURE (Right Finger)     Patient location during evaluation: PACU Anesthesia Type: Regional Level of consciousness: awake and alert Pain management: pain level controlled Vital Signs Assessment: post-procedure vital signs reviewed and stable Respiratory status: spontaneous breathing, nonlabored ventilation, respiratory function stable and patient connected to nasal cannula oxygen Cardiovascular status: blood pressure returned to baseline and stable Postop Assessment: no apparent nausea or vomiting Anesthetic complications: no Comments: Converted to GA due to poor toleration of MAC; obstruction vs combative.    No complications documented.  Last Vitals:  Vitals:   09/25/20 0947 09/25/20 1000  BP:  121/72  Pulse: (!) 59 (!) 50  Resp: 18 16  Temp:  (!) 36.2 C  SpO2: 97% 96%    Last Pain:  Vitals:   09/25/20 1000  TempSrc:   PainSc: 0-No pain                 Effie Berkshire

## 2020-09-25 NOTE — Progress Notes (Signed)
Assisted Dr. Hollis with right, ultrasound guided, supraclavicular block. Side rails up, monitors on throughout procedure. See vital signs in flow sheet. Tolerated Procedure well. 

## 2020-09-25 NOTE — Transfer of Care (Signed)
Immediate Anesthesia Transfer of Care Note  Patient: Joseph Bartlett  Procedure(s) Performed: OPEN REDUCTION INTERNAL FIXATION RIGHT FIFTH METACARPAL FRACTURE (Right Finger)  Patient Location: PACU  Anesthesia Type:GA combined with regional for post-op pain  Level of Consciousness: drowsy and patient cooperative  Airway & Oxygen Therapy: Patient Spontanous Breathing and Patient connected to face mask oxygen  Post-op Assessment: Report given to RN and Post -op Vital signs reviewed and stable  Post vital signs: Reviewed and stable  Last Vitals:  Vitals Value Taken Time  BP    Temp    Pulse    Resp    SpO2      Last Pain:  Vitals:   09/25/20 0633  TempSrc: Oral  PainSc: 0-No pain         Complications: No complications documented.

## 2020-09-25 NOTE — Anesthesia Procedure Notes (Signed)
Procedure Name: MAC Date/Time: 09/25/2020 7:40 AM Performed by: Signe Colt, CRNA Pre-anesthesia Checklist: Patient identified, Emergency Drugs available, Suction available, Patient being monitored and Timeout performed Patient Re-evaluated:Patient Re-evaluated prior to induction Oxygen Delivery Method: Simple face mask

## 2020-09-25 NOTE — Interval H&P Note (Signed)
History and Physical Interval Note:  09/25/2020 7:35 AM  Joseph Bartlett  has presented today for surgery, with the diagnosis of RIGHT FIFTH METACARPAL FRACTURE.  The various methods of treatment have been discussed with the patient and family. After consideration of risks, benefits and other options for treatment, the patient has consented to  Procedure(s) with comments: OPEN REDUCTION INTERNAL FIXATION RIGHT FIFTH METACARPAL FRACTURE (Right) - PRE OP BLOCK as a surgical intervention.  The patient's history has been reviewed, patient examined, no change in status, stable for surgery.  I have reviewed the patient's chart and labs.  Questions were answered to the patient's satisfaction.     Jodi Marble

## 2020-09-25 NOTE — Discharge Instructions (Addendum)
Discharge Instructions   You have a dressing with a plaster splint incorporated in it. Move your fingers as much as possible, making a full fist and fully opening the fist.  NO LIFTING, GRIPPING, OR GRASPING MORE THAN PAPER/PENCIL TASKS WITH YOUR RIGHT HAND Elevate your hand to reduce pain & swelling of the digits.  Ice over the operative site may be helpful to reduce pain & swelling.  DO NOT USE HEAT.  Leave the dressing in place until you return to our office.  You may shower, but keep the bandage clean & dry.  You may drive a car when you are off of prescription pain medications and can safely control your vehicle with both hands. Our office will call you to arrange follow-up   Please call [phone] during normal business hours or [phone] after hours for any problems. Including the following:  - excessive redness of the incisions - drainage for more than 4 days - fever of more than 101.5 F  *Please note that pain medications will not be refilled after hours or on weekends.   Post Anesthesia Home Care Instructions  Activity: Get plenty of rest for the remainder of the day. A responsible individual must stay with you for 24 hours following the procedure.  For the next 24 hours, DO NOT: -Drive a car -Advertising copywriter -Drink alcoholic beverages -Take any medication unless instructed by your physician -Make any legal decisions or sign important papers.  Meals: Start with liquid foods such as gelatin or soup. Progress to regular foods as tolerated. Avoid greasy, spicy, heavy foods. If nausea and/or vomiting occur, drink only clear liquids until the nausea and/or vomiting subsides. Call your physician if vomiting continues.  Special Instructions/Symptoms: Your throat may feel dry or sore from the anesthesia or the breathing tube placed in your throat during surgery. If this causes discomfort, gargle with warm salt water. The discomfort should disappear within 24 hours.  If  you had a scopolamine patch placed behind your ear for the management of post- operative nausea and/or vomiting:  1. The medication in the patch is effective for 72 hours, after which it should be removed.  Wrap patch in a tissue and discard in the trash. Wash hands thoroughly with soap and water. 2. You may remove the patch earlier than 72 hours if you experience unpleasant side effects which may include dry mouth, dizziness or visual disturbances. 3. Avoid touching the patch. Wash your hands with soap and water after contact with the patch.    Regional Anesthesia Blocks  1. Numbness or the inability to move the "blocked" extremity may last from 3-48 hours after placement. The length of time depends on the medication injected and your individual response to the medication. If the numbness is not going away after 48 hours, call your surgeon.  2. The extremity that is blocked will need to be protected until the numbness is gone and the  Strength has returned. Because you cannot feel it, you will need to take extra care to avoid injury. Because it may be weak, you may have difficulty moving it or using it. You may not know what position it is in without looking at it while the block is in effect.  3. For blocks in the legs and feet, returning to weight bearing and walking needs to be done carefully. You will need to wait until the numbness is entirely gone and the strength has returned. You should be able to move your leg and foot normally  before you try and bear weight or walk. You will need someone to be with you when you first try to ensure you do not fall and possibly risk injury.  4. Bruising and tenderness at the needle site are common side effects and will resolve in a few days.  5. Persistent numbness or new problems with movement should be communicated to the surgeon or the [facility] [phone] [facility] [phone]).

## 2020-09-25 NOTE — Anesthesia Procedure Notes (Signed)
Anesthesia Regional Block: Supraclavicular block   Pre-Anesthetic Checklist: ,, timeout performed, Correct Patient, Correct Site, Correct Laterality, Correct Procedure, Correct Position, site marked, Risks and benefits discussed,  Surgical consent,  Pre-op evaluation,  At surgeon's request and post-op pain management  Laterality: Right  Prep: chloraprep       Needles:  Injection technique: Single-shot  Needle Type: Echogenic Stimulator Needle     Needle Length: 9cm  Needle Gauge: 21     Additional Needles:   Procedures:,,,, ultrasound used (permanent image in chart),,,,  Narrative:  Start time: 09/25/2020 7:00 AM End time: 09/25/2020 7:05 AM Injection made incrementally with aspirations every 5 mL.  Performed by: Personally  Anesthesiologist: Shelton Silvas, MD  Additional Notes: Patient tolerated the procedure well. Local anesthetic introduced in an incremental fashion under minimal resistance after negative aspirations. No paresthesias were elicited. After completion of the procedure, no acute issues were identified and patient continued to be monitored by RN.

## 2020-09-25 NOTE — Anesthesia Preprocedure Evaluation (Signed)
Anesthesia Evaluation  Patient identified by MRN, date of birth, ID band Patient awake    Reviewed: Allergy & Precautions, NPO status , Patient's Chart, lab work & pertinent test results  Airway Mallampati: II       Dental no notable dental hx.    Pulmonary Current Smoker,    Pulmonary exam normal        Cardiovascular negative cardio ROS Normal cardiovascular exam     Neuro/Psych negative neurological ROS  negative psych ROS   GI/Hepatic negative GI ROS, Neg liver ROS,   Endo/Other  negative endocrine ROS  Renal/GU negative Renal ROS     Musculoskeletal negative musculoskeletal ROS (+)   Abdominal   Peds  Hematology   Anesthesia Other Findings   Reproductive/Obstetrics                             Anesthesia Physical Anesthesia Plan  ASA: I  Anesthesia Plan: Regional   Post-op Pain Management:    Induction: Intravenous  PONV Risk Score and Plan: 1 and Propofol infusion and Ondansetron  Airway Management Planned: Natural Airway and Simple Face Mask  Additional Equipment: None  Intra-op Plan:   Post-operative Plan:   Informed Consent: I have reviewed the patients History and Physical, chart, labs and discussed the procedure including the risks, benefits and alternatives for the proposed anesthesia with the patient or authorized representative who has indicated his/her understanding and acceptance.       Plan Discussed with: CRNA  Anesthesia Plan Comments:         Anesthesia Quick Evaluation

## 2020-09-26 ENCOUNTER — Encounter (HOSPITAL_BASED_OUTPATIENT_CLINIC_OR_DEPARTMENT_OTHER): Payer: Self-pay | Admitting: Orthopedic Surgery
